# Patient Record
Sex: Male | Born: 1955 | Race: White | Hispanic: No | Marital: Married
Health system: Southern US, Community
[De-identification: ages and names within clinical notes are randomized; demographics above are authoritative.]

---

## 2019-08-30 ENCOUNTER — Ambulatory Visit: Payer: Self-pay | Attending: Internal Medicine

## 2019-08-30 DIAGNOSIS — Z23 Encounter for immunization: Secondary | ICD-10-CM

## 2019-08-30 NOTE — Progress Notes (Signed)
   Covid-19 Vaccination Clinic  Name:  Jacob Evans    MRN: [id number] DOB: [birth year]  08/30/2019  [name] was observed post Covid-19 immunization for 15 minutes without incident. He was provided with Vaccine Information Sheet and instruction to access the V-Safe system.   [name] was instructed to call 911 with any severe reactions post vaccine: Marland Kitchen Difficulty breathing  . Swelling of face and throat  . A fast heartbeat  . A bad rash all over body  . Dizziness and weakness   Immunizations Administered    Name Date Dose VIS Date Route   Pfizer COVID-19 Vaccine 08/30/2019 11:33 AM 0.3 mL 05/24/2019 Intramuscular   Manufacturer: [place]   Lot: [id number]   [place]: [id number]

## 2019-09-24 ENCOUNTER — Ambulatory Visit: Payer: Self-pay | Attending: Internal Medicine

## 2019-09-24 DIAGNOSIS — Z23 Encounter for immunization: Secondary | ICD-10-CM

## 2019-09-24 NOTE — Progress Notes (Signed)
   Covid-19 Vaccination Clinic  Name:  Jacob Evans    MRN: [id number] DOB: 1956/01/14  09/24/2019  Mr. [name] was observed post Covid-19 immunization for 15 minutes without incident. He was provided with Vaccine Information Sheet and instruction to access the V-Safe system.   [name] was instructed to call 911 with any severe reactions post vaccine: Marland Kitchen Difficulty breathing  . Swelling of face and throat  . A fast heartbeat  . A bad rash all over body  . Dizziness and weakness   Immunizations Administered    Name Date Dose VIS Date Route   Pfizer COVID-19 Vaccine 09/24/2019 12:39 PM 0.3 mL 05/24/2019 Intramuscular   Manufacturer: [place]   Lot: [id number]   [place]: [id number]

## 2020-07-24 ENCOUNTER — Other Ambulatory Visit: Payer: Self-pay | Admitting: Internal Medicine

## 2020-07-24 DIAGNOSIS — Z0000 Encounter for general adult medical examination without abnormal findings: Secondary | ICD-10-CM

## 2020-07-29 ENCOUNTER — Other Ambulatory Visit (HOSPITAL_COMMUNITY): Payer: Self-pay | Admitting: Internal Medicine

## 2020-07-29 DIAGNOSIS — Z136 Encounter for screening for cardiovascular disorders: Secondary | ICD-10-CM

## 2020-07-30 ENCOUNTER — Ambulatory Visit (HOSPITAL_COMMUNITY)
Admission: RE | Admit: 2020-07-30 | Discharge: 2020-07-30 | Disposition: A | Discharge status: Home / Self Care | Payer: Medicare Other | Source: Ambulatory Visit | Attending: Internal Medicine | Admitting: Internal Medicine

## 2020-07-30 ENCOUNTER — Other Ambulatory Visit: Payer: Self-pay

## 2020-07-30 DIAGNOSIS — Z136 Encounter for screening for cardiovascular disorders: Secondary | ICD-10-CM | POA: Diagnosis not present

## 2020-08-11 ENCOUNTER — Ambulatory Visit
Admission: RE | Admit: 2020-08-11 | Discharge: 2020-08-11 | Disposition: A | Discharge status: Home / Self Care | Payer: Medicare Other | Source: Ambulatory Visit | Attending: Internal Medicine | Admitting: Internal Medicine

## 2020-08-11 ENCOUNTER — Ambulatory Visit: Payer: Medicare Other

## 2020-08-11 DIAGNOSIS — Z0000 Encounter for general adult medical examination without abnormal findings: Secondary | ICD-10-CM

## 2021-05-25 ENCOUNTER — Other Ambulatory Visit: Payer: Self-pay | Admitting: Internal Medicine

## 2021-05-25 DIAGNOSIS — Z0000 Encounter for general adult medical examination without abnormal findings: Secondary | ICD-10-CM

## 2021-05-25 DIAGNOSIS — R911 Solitary pulmonary nodule: Secondary | ICD-10-CM

## 2021-08-16 ENCOUNTER — Other Ambulatory Visit: Payer: Self-pay

## 2021-08-16 ENCOUNTER — Ambulatory Visit
Admission: RE | Admit: 2021-08-16 | Discharge: 2021-08-16 | Disposition: A | Discharge status: Home / Self Care | Payer: Medicare Other | Source: Ambulatory Visit | Attending: Internal Medicine | Admitting: Internal Medicine

## 2021-08-16 DIAGNOSIS — Z0000 Encounter for general adult medical examination without abnormal findings: Secondary | ICD-10-CM

## 2021-08-16 DIAGNOSIS — R911 Solitary pulmonary nodule: Secondary | ICD-10-CM

## 2021-12-03 ENCOUNTER — Other Ambulatory Visit: Payer: Self-pay | Admitting: Internal Medicine

## 2021-12-03 DIAGNOSIS — D171 Benign lipomatous neoplasm of skin and subcutaneous tissue of trunk: Secondary | ICD-10-CM

## 2021-12-03 DIAGNOSIS — R911 Solitary pulmonary nodule: Secondary | ICD-10-CM

## 2021-12-13 ENCOUNTER — Other Ambulatory Visit: Payer: Self-pay | Admitting: Internal Medicine

## 2021-12-13 DIAGNOSIS — R222 Localized swelling, mass and lump, trunk: Secondary | ICD-10-CM

## 2022-01-10 ENCOUNTER — Ambulatory Visit
Admission: RE | Admit: 2022-01-10 | Discharge: 2022-01-10 | Disposition: A | Discharge status: Home / Self Care | Payer: Medicare Other | Source: Ambulatory Visit | Attending: Internal Medicine | Admitting: Internal Medicine

## 2022-01-10 DIAGNOSIS — R222 Localized swelling, mass and lump, trunk: Secondary | ICD-10-CM

## 2022-01-10 MED ORDER — IOPAMIDOL (ISOVUE-300) INJECTION 61%
100.0000 mL | Freq: Once | INTRAVENOUS | Status: AC | PRN
  Administered 2022-01-10: 100 mL via INTRAVENOUS

## 2022-08-29 ENCOUNTER — Ambulatory Visit
Admission: RE | Admit: 2022-08-29 | Discharge: 2022-08-29 | Disposition: A | Discharge status: Home / Self Care | Payer: Medicare Other | Source: Ambulatory Visit | Attending: Internal Medicine | Admitting: Internal Medicine

## 2022-08-29 DIAGNOSIS — R911 Solitary pulmonary nodule: Secondary | ICD-10-CM

## 2022-12-08 ENCOUNTER — Other Ambulatory Visit: Payer: Self-pay | Admitting: Internal Medicine

## 2022-12-08 DIAGNOSIS — R911 Solitary pulmonary nodule: Secondary | ICD-10-CM

## 2023-04-24 IMAGING — CT CT CHEST LUNG CANCER SCREENING LOW DOSE W/O CM
1 series · 10 of 10 positions shown, 13 images · non-contrast
Comparison: 08/11/2020.

CLINICAL DATA: Former smoker, quit 5 years ago, 30 pack-year
history.



[ct lung segmentation data · axial · 0.81mm/px · z∈[-386,-386]mm · 10 of 331 frames shown]
[frame 1/331  mediastinal]
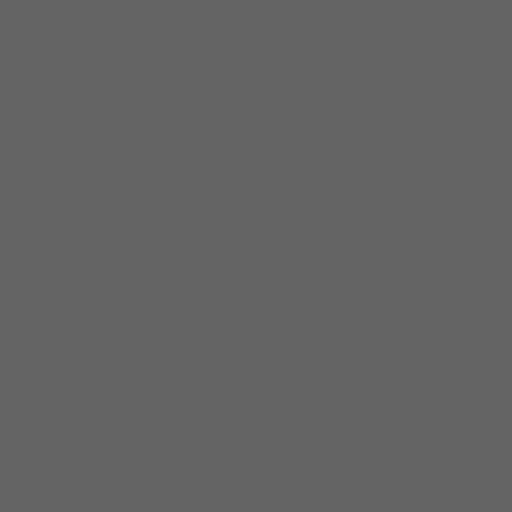
[frame 1/331  lung]
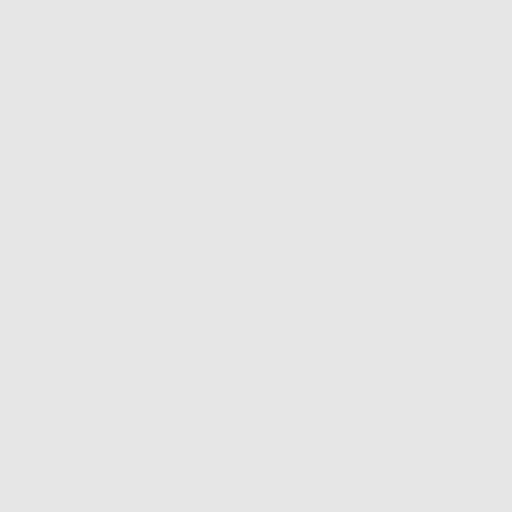
[frame 37/331  lung]
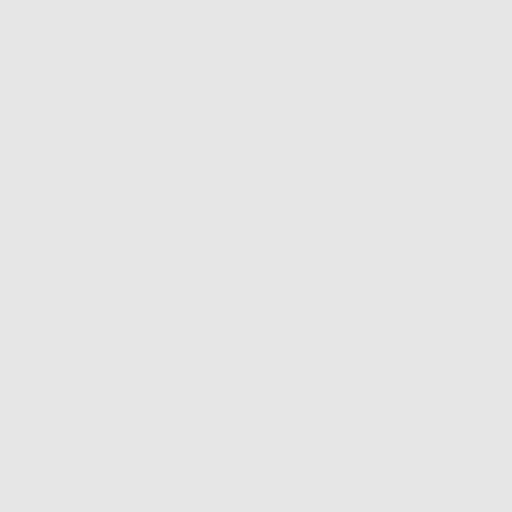
[frame 74/331  lung]
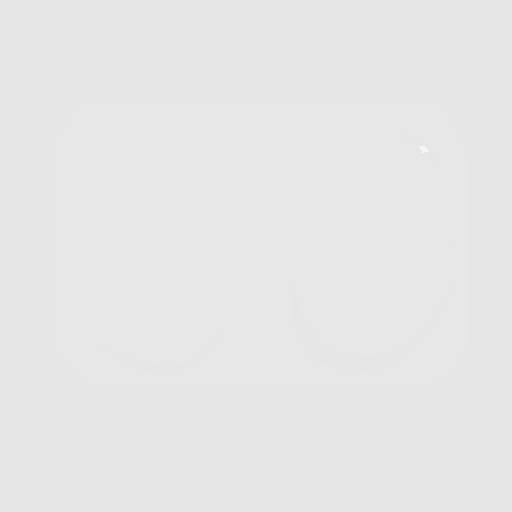
[frame 111/331  lung]
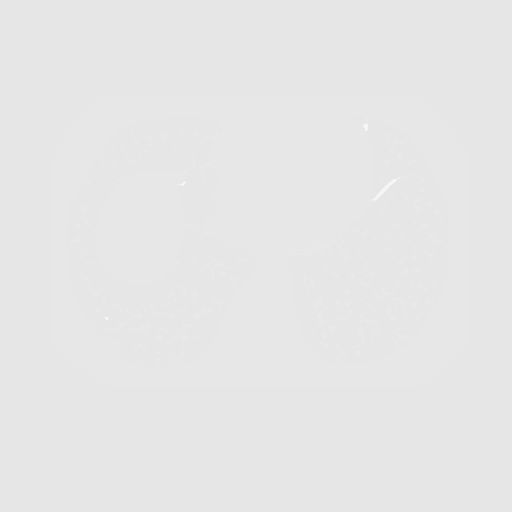
[frame 147/331  mediastinal]
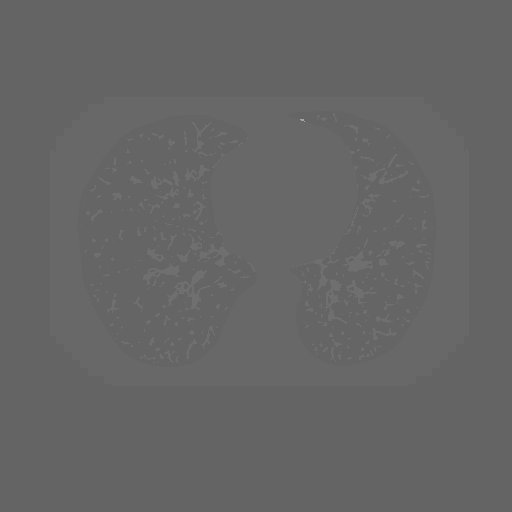
[frame 147/331  lung]
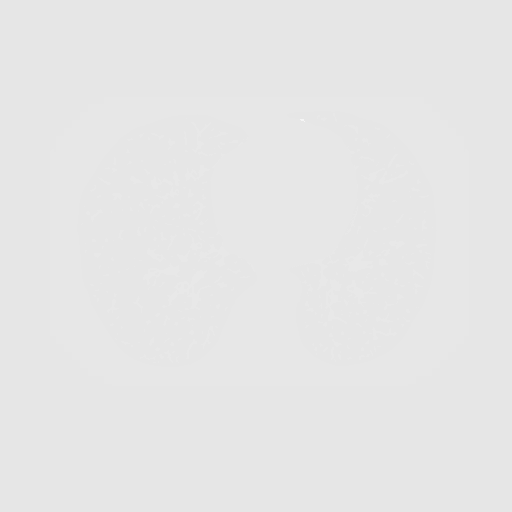
[frame 184/331  lung]
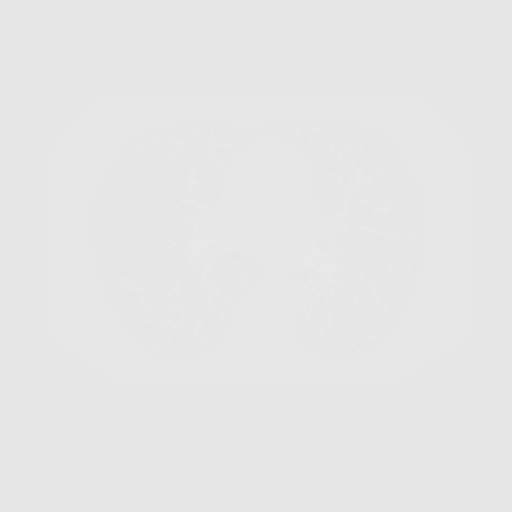
[frame 221/331  lung]
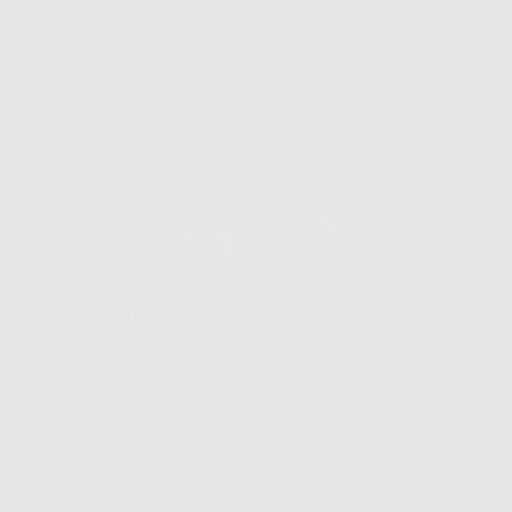
[frame 257/331  lung]
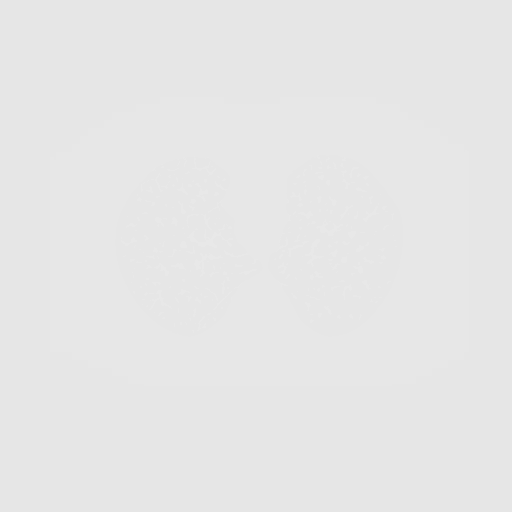
[frame 294/331  mediastinal]
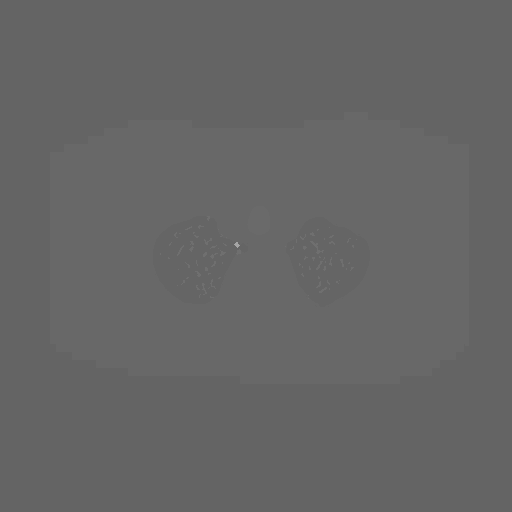
[frame 294/331  lung]
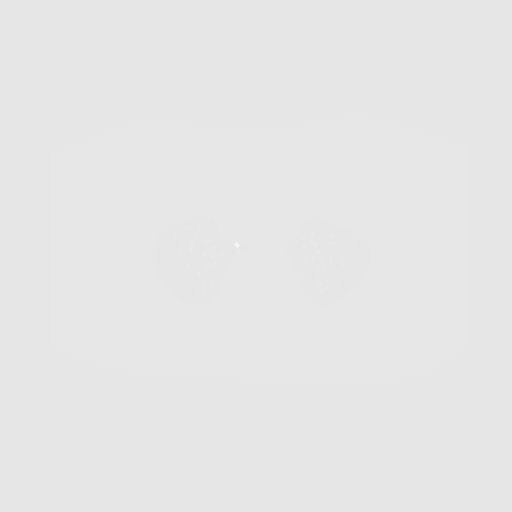
[frame 331/331  lung]
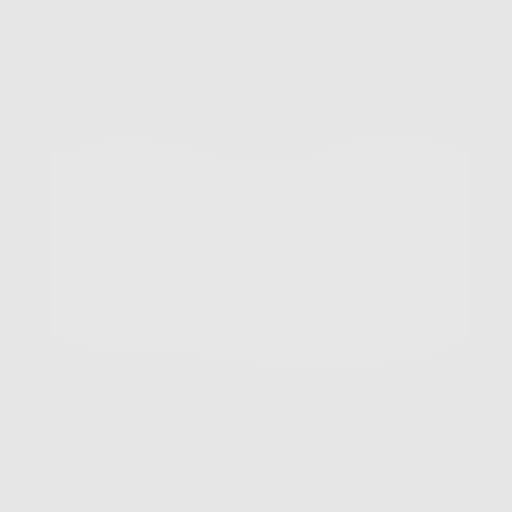

[10 of 10 positions shown; findings below may reference images not displayed]

FINDINGS: Cardiovascular: Coronary artery calcification. Heart size normal. No
pericardial effusion.

Mediastinum/Nodes: No pathologically enlarged mediastinal or
axillary lymph nodes. Hilar regions are difficult to definitively
evaluate without IV contrast. Esophagus is grossly unremarkable.

Lungs/Pleura: Centrilobular and paraseptal emphysema. No suspicious
pulmonary nodules. No pleural fluid. Airway is unremarkable.

Upper Abdomen: Visualized portions of the liver, gallbladder,
adrenal glands, kidneys, spleen, pancreas, stomach and bowel are
grossly unremarkable.

Musculoskeletal: Degenerative changes in the spine. No worrisome
lytic or sclerotic lesions.
IMPRESSION: 1. Lung-RADS 2, benign appearance or behavior. Continue annual
screening with low-dose chest CT without contrast in 12 months.
2. Coronary artery calcification.
3.  Emphysema ([id number]).

## 2023-08-30 ENCOUNTER — Ambulatory Visit
Admission: RE | Admit: 2023-08-30 | Discharge: 2023-08-30 | Disposition: A | Discharge status: Home / Self Care | Payer: Medicare Other | Source: Ambulatory Visit | Attending: Internal Medicine

## 2023-08-30 DIAGNOSIS — R911 Solitary pulmonary nodule: Secondary | ICD-10-CM

## 2024-04-08 ENCOUNTER — Encounter (HOSPITAL_COMMUNITY): Payer: Self-pay | Admitting: Emergency Medicine

## 2024-04-08 ENCOUNTER — Ambulatory Visit (HOSPITAL_COMMUNITY): Admission: EM | Admit: 2024-04-08 | Discharge: 2024-04-08 | Disposition: A | Discharge status: Home / Self Care

## 2024-04-08 DIAGNOSIS — S61411A Laceration without foreign body of right hand, initial encounter: Secondary | ICD-10-CM | POA: Diagnosis not present

## 2024-04-08 MED ORDER — LIDOCAINE-EPINEPHRINE 1 %-1:100000 IJ SOLN
INTRAMUSCULAR | Status: AC
  Filled 2024-04-08: qty 1

## 2024-04-08 NOTE — Discharge Instructions (Signed)
 There are 3 stitches in your cut  They need to be removed in about 7 days.  You can make an online appointment for us  to do that for you.  1-2 times a day wash the wound with soapy water and put new triple antibiotic ointment on it and a clean bandage.  Return sooner if it starts looking red or draining.

## 2024-04-08 NOTE — ED Triage Notes (Signed)
 This evening was cutting a tie with a knife and cut back of hand.   Last tetanus shot was this year.

## 2024-04-08 NOTE — ED Notes (Signed)
Pressure dressing applied to laceration.

## 2024-04-08 NOTE — ED Provider Notes (Signed)
 [facility]    CSN: [id number] Arrival date & time: 04/08/24  1953      History   Chief Complaint Chief Complaint  Patient presents with   Laceration    HPI Lenix Kidd is a 68 y.o. male.    Laceration  Here for a cut on his right hand.  He was cutting a cable tied with his pocket knife and it ended up cutting on the dorsum of his right hand.  Last tetanus shot was this year as [place].  NKDA   History reviewed. No pertinent past medical history.  There are no active problems to display for this patient.   History reviewed. No pertinent surgical history.     Home Medications    Prior to Admission medications   Medication Sig Start Date End Date Taking? Authorizing Provider  atorvastatin (LIPITOR) 10 MG tablet  09/19/19  Yes [provider]  losartan (COZAAR) 100 MG tablet 1 tablet Orally Once a day; Duration: 90 days 11/15/19  Yes [provider]    Family History No family history on file.  Social History Social History   Tobacco Use   Smoking status: Never   Smokeless tobacco: Never  Vaping Use   Vaping status: Never Used  Substance Use Topics   Alcohol use: Yes   Drug use: Never     Allergies   Patient has no known allergies.   Review of Systems Review of Systems   Physical Exam Triage Vital Signs ED Triage Vitals  Encounter Vitals Group     BP 04/08/24 2037 (!) 145/81     Girls Systolic BP Percentile --      Girls Diastolic BP Percentile --      Boys Systolic BP Percentile --      Boys Diastolic BP Percentile --      Pulse Rate 04/08/24 2037 78     Resp 04/08/24 2037 20     Temp 04/08/24 2037 98 F (36.7 C)     Temp Source 04/08/24 2037 Oral     SpO2 04/08/24 2037 97 %     Weight --      Height --      Head Circumference --      Peak Flow --      Pain Score 04/08/24 2034 4     Pain Loc --      Pain Education --      Exclude from Growth Chart --    No data  found.  Updated Vital Signs BP (!) 145/81 (BP Location: Left Arm)   Pulse 78   Temp 98 F (36.7 C) (Oral)   Resp 20   SpO2 97%   Visual Acuity Right Eye Distance:   Left Eye Distance:   Bilateral Distance:    Right Eye Near:   Left Eye Near:    Bilateral Near:     Physical Exam Vitals reviewed.  Constitutional:      General: He is not in acute distress.    Appearance: He is not ill-appearing, toxic-appearing or diaphoretic.  Skin:    Coloration: Skin is not pale.     Comments: There is a laceration that is 2 cm in diameter on the dorsum of his right hand overlying the first metacarpal and webspace between the first metacarpal and second metacarpal.  Bleeding is controlled at the time  Neurological:     General: No focal deficit present.     Mental  Status: He is alert and oriented to person, place, and time.  Psychiatric:        Behavior: Behavior normal.      UC Treatments / Results  Labs (all labs ordered are listed, but only abnormal results are displayed) Labs Reviewed - No data to display  EKG   Radiology No results found.  Procedures Procedures (including critical care time)  Medications Ordered in UC Medications - No data to display  Initial Impression / Assessment and Plan / UC Course  I have reviewed the triage vital signs and the nursing notes.  Pertinent labs & imaging results that were available during my care of the patient were reviewed by me and considered in my medical decision making (see chart for details).     Visit and benefits of laceration repair are discussed and he gives verbal consent  1% lidocaine with epinephrine is used for infiltration anesthesia with good results. Dermal wound cleanser is used to clean the wound and skin around it  Under clean conditions 5-0 Prolene is used to place 3 sutures in the cut with good approximation  The wound is not bleeding at discharge.  Bandages applied and wound care is explained  Return  to clinic in 7 days for suture removal. Final Clinical Impressions(s) / UC Diagnoses   Final diagnoses:  Laceration of right hand without foreign body, initial encounter     Discharge Instructions      There are 3 stitches in your cut  They need to be removed in about 7 days.  You can make an online appointment for us  to do that for you.  1-2 times a day wash the wound with soapy water and put new triple antibiotic ointment on it and a clean bandage.  Return sooner if it starts looking red or draining.    ED Prescriptions   None    PDMP not reviewed this encounter.   Vonna Sharlet POUR, MD 04/08/24 2059

## 2024-04-16 ENCOUNTER — Ambulatory Visit (HOSPITAL_COMMUNITY)
Admission: RE | Admit: 2024-04-16 | Discharge: 2024-04-16 | Disposition: A | Discharge status: Home / Self Care | Source: Ambulatory Visit | Attending: Family Medicine | Admitting: Family Medicine

## 2024-04-16 DIAGNOSIS — Z4802 Encounter for removal of sutures: Secondary | ICD-10-CM

## 2024-04-16 NOTE — ED Triage Notes (Signed)
Removed 3 sutures from rt hand. No redness or drainage noted.

## 2024-07-11 ENCOUNTER — Other Ambulatory Visit: Payer: Self-pay | Admitting: Internal Medicine

## 2024-07-11 DIAGNOSIS — R911 Solitary pulmonary nodule: Secondary | ICD-10-CM

## 2024-09-02 ENCOUNTER — Other Ambulatory Visit
